# Patient Record
Sex: Male | Born: 1965 | Race: White | Hispanic: No | Smoking: Never smoker
Health system: Southern US, Community
[De-identification: ages and names within clinical notes are randomized; demographics above are authoritative.]

## PROBLEM LIST (undated history)

## (undated) DIAGNOSIS — C61 Malignant neoplasm of prostate: Secondary | ICD-10-CM

## (undated) DIAGNOSIS — I10 Essential (primary) hypertension: Secondary | ICD-10-CM

## (undated) DIAGNOSIS — K219 Gastro-esophageal reflux disease without esophagitis: Secondary | ICD-10-CM

## (undated) HISTORY — DX: Essential (primary) hypertension: I10

## (undated) HISTORY — PX: PROSTATE BIOPSY: SHX241

## (undated) HISTORY — DX: Gastro-esophageal reflux disease without esophagitis: K21.9

---

## 2009-05-04 ENCOUNTER — Ambulatory Visit: Payer: Self-pay | Admitting: Diagnostic Radiology

## 2009-05-04 ENCOUNTER — Emergency Department (HOSPITAL_BASED_OUTPATIENT_CLINIC_OR_DEPARTMENT_OTHER): Admission: EM | Admit: 2009-05-04 | Discharge: 2009-05-04 | Payer: Self-pay | Admitting: Emergency Medicine

## 2010-08-07 IMAGING — CT CT PELVIS W/ CM
2 of 6 series · 15 of 46 positions shown, 17 images · IV contrast (APPLIED)
Comparison: None available.

CT ABDOMEN

CLINICAL DATA: Abdominal pain.  Groin pain after injury.

CT ABDOMEN AND PELVIS WITH CONTRAST
TECHNIQUE: Multidetector CT imaging of the abdomen and pelvis was
performed using the standard protocol following bolus
administration of intravenous contrast.
Contrast: 100 ml [id number].

[Series 2: abd/pelvis 5.0 b31f · axial · 0.74mm/px · z∈[-587,-77]mm · 12 of 112 slices shown, 14 images]
[im 5/112  soft-tissue]
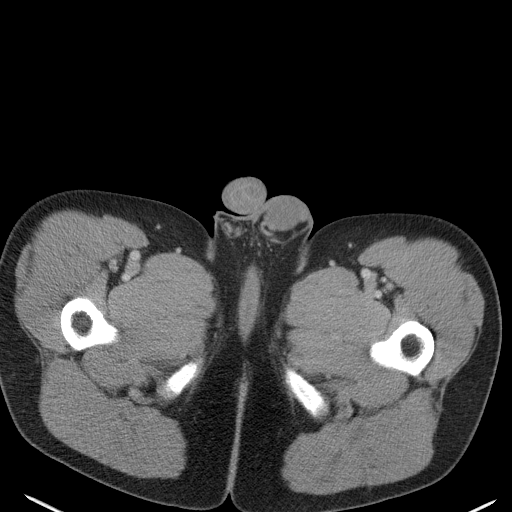
[im 5/112  bone]
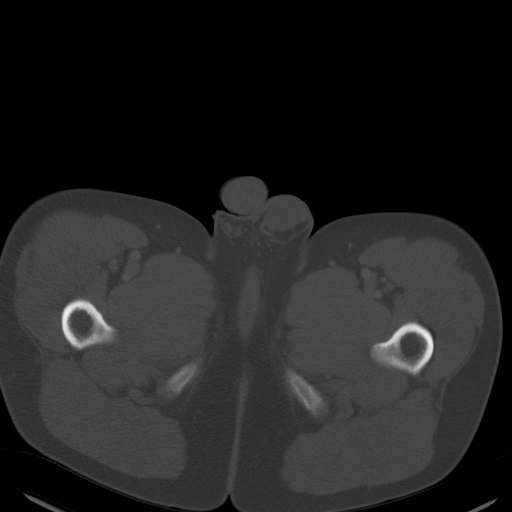
[im 15/112  soft-tissue]
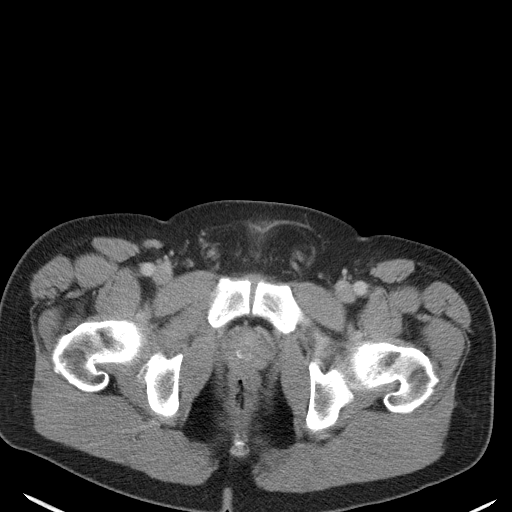
[im 25/112  soft-tissue]
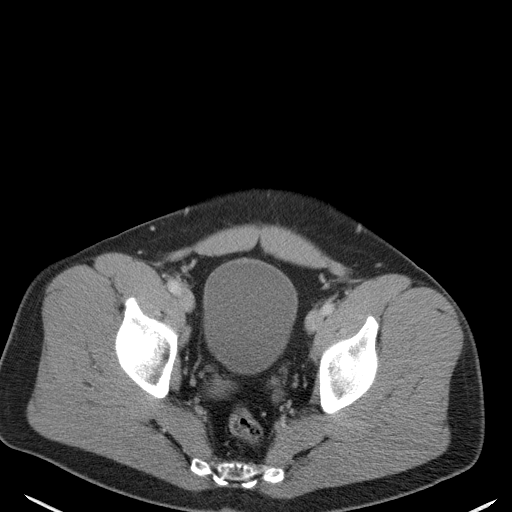
[im 34/112  soft-tissue]
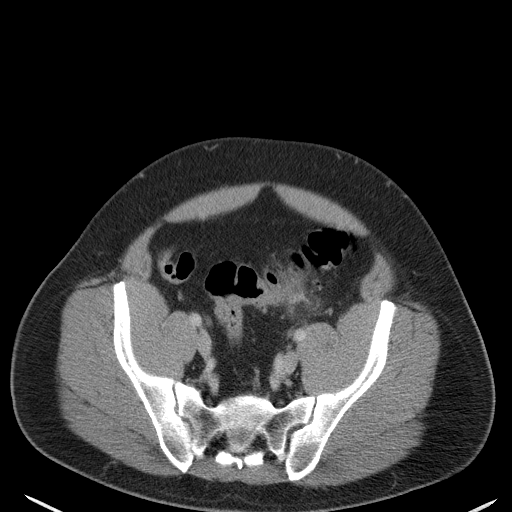
[im 44/112  soft-tissue]
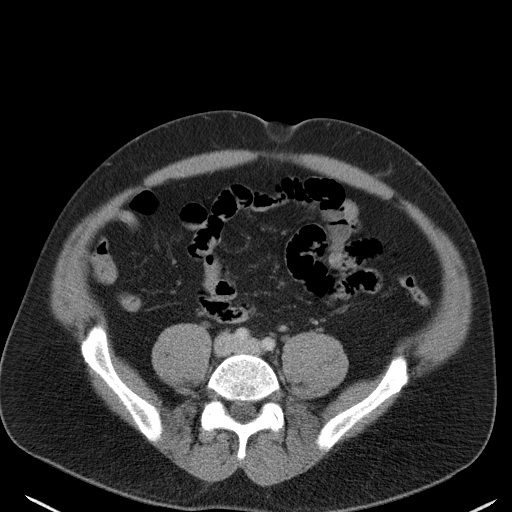
[im 54/112  soft-tissue]
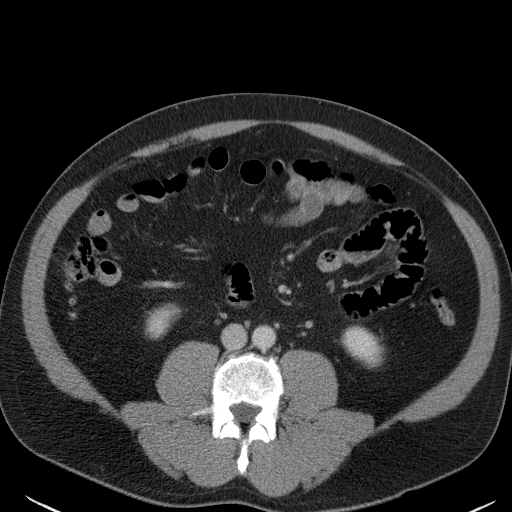
[im 58/112  soft-tissue]
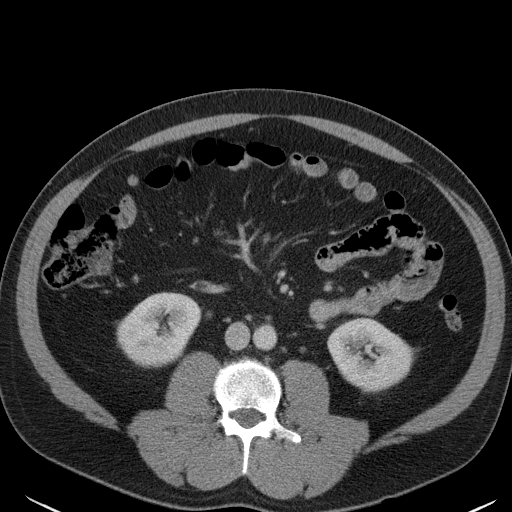
[im 68/112  soft-tissue]
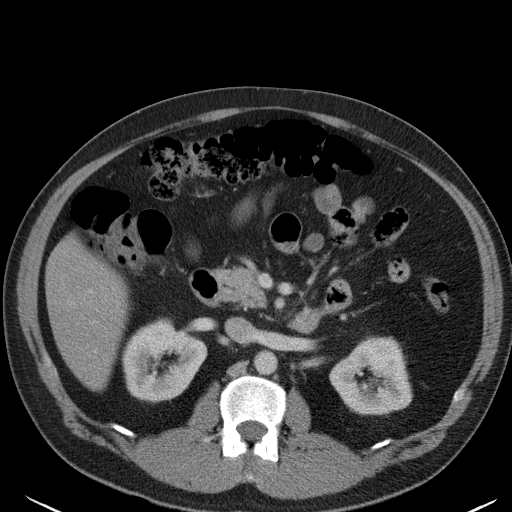
[im 78/112  soft-tissue]
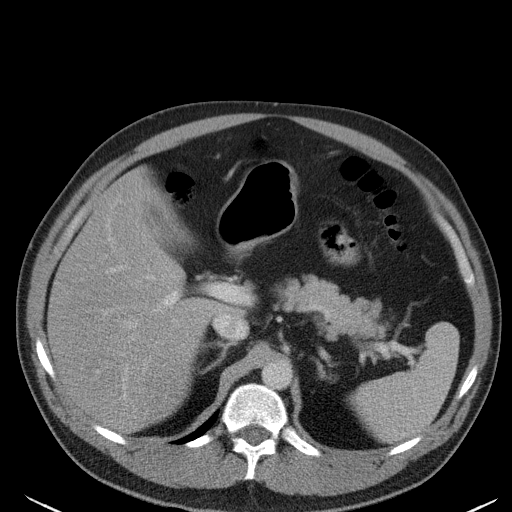
[im 78/112  bone]
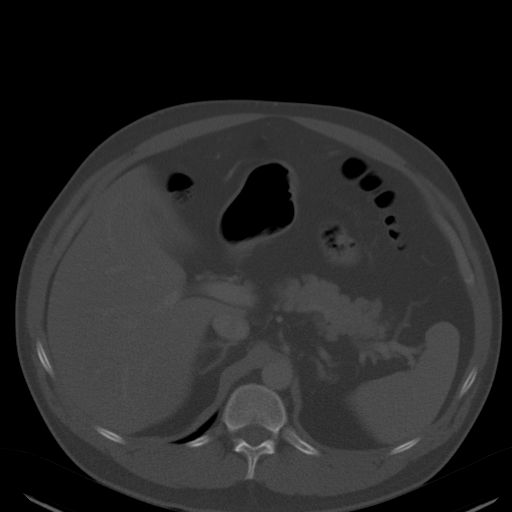
[im 87/112  soft-tissue]
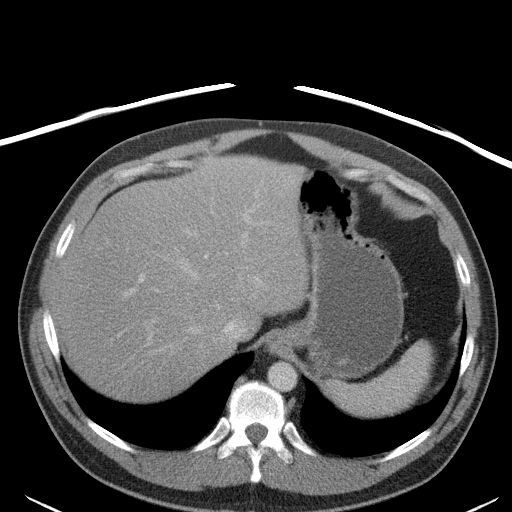
[im 97/112  soft-tissue]
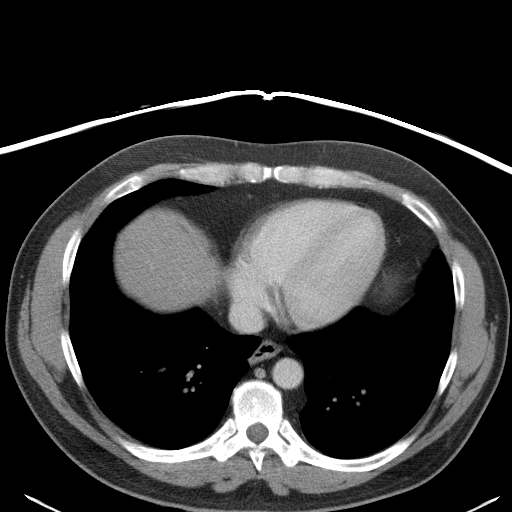
[im 107/112  soft-tissue]
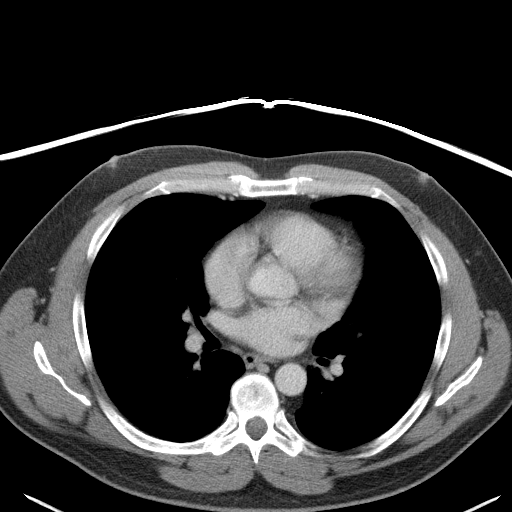

[Series 5: abd/pelvis 3.0 coronal · coronal · 0.68mm/px · 3 of 104 slices shown]
[im 35/104  soft-tissue]
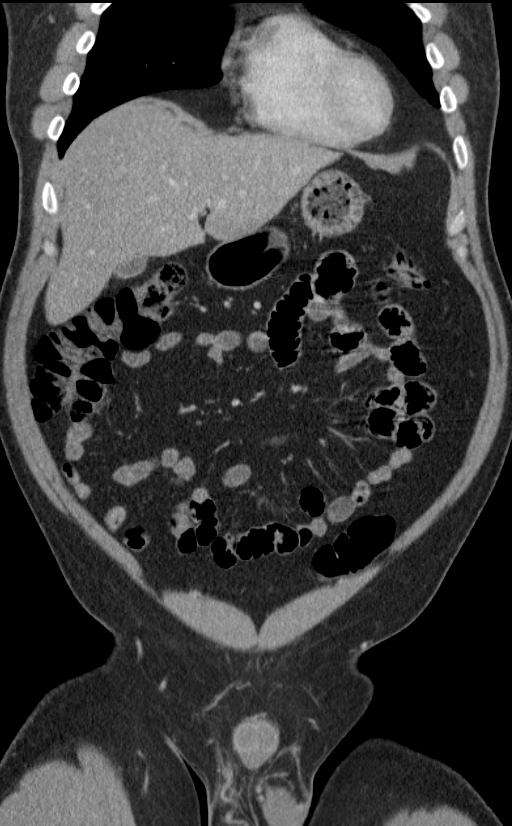
[im 46/104  soft-tissue]
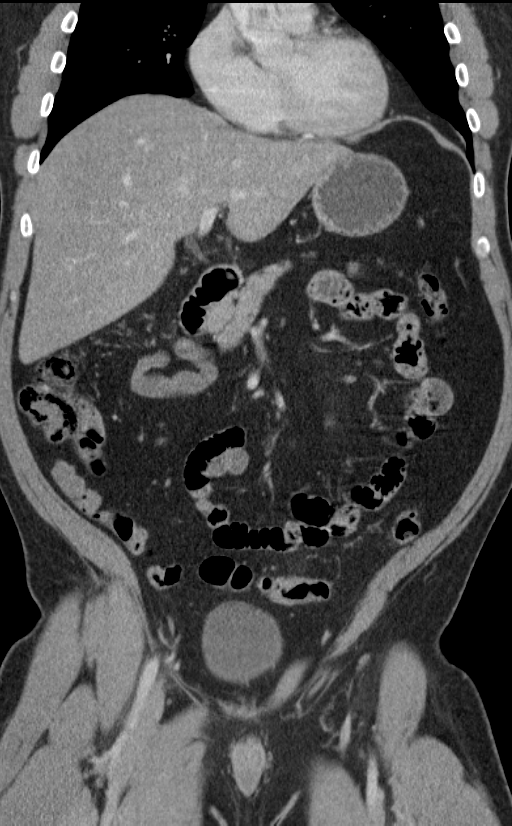
[im 58/104  soft-tissue]
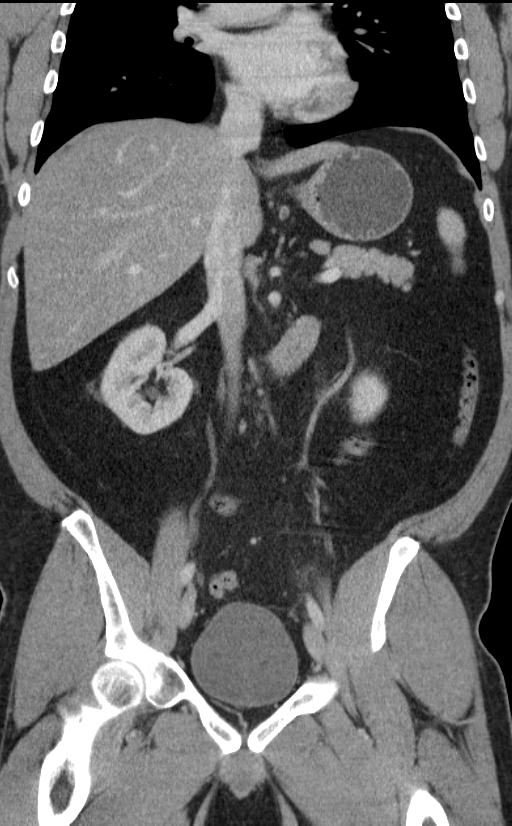

[15 of 46 positions shown; findings below may reference images not displayed]

FINDINGS: The lung bases are clear.  There is no pleural or
pericardial effusion.

The gallbladder is decompressed but otherwise unremarkable.  The
liver, spleen, adrenal glands, pancreas and kidneys all appear
normal.  Stomach and small bowel are normal in appearance.  There
is no abdominal lymphadenopathy or fluid.  No focal bony
abnormality. There is some degenerative disc disease of the lumbar
spine most notable at L2-3.
IMPRESSION: 1.  No acute finding.
2.  Degenerative disc disease lumbar spine.

CT PELVIS
FINDINGS: The patient has diverticular disease of the sigmoid
colon with inflammatory change identified in the left lower
quadrant consistent with diverticulitis.  No abscess or
perforation.  The colon is otherwise unremarkable.  The appendix
appears normal.  Prostate calcification noted.  Seminal vesicles
and urinary bladder are unremarkable.  No lymphadenopathy.  No
evidence of muscle strain.
IMPRESSION: 1.  Study is positive for sigmoid diverticulitis without abscess or
perforation.
2.  Negative for evidence of muscle strain.

## 2011-01-06 LAB — CBC
MCHC: 34 g/dL (ref 30.0–36.0)
MCV: 86.5 fL (ref 78.0–100.0)
Platelets: 350 10*3/uL (ref 150–400)
RBC: 5.08 MIL/uL (ref 4.22–5.81)

## 2011-01-06 LAB — URINALYSIS, ROUTINE W REFLEX MICROSCOPIC
Bilirubin Urine: NEGATIVE
Glucose, UA: NEGATIVE mg/dL
Hgb urine dipstick: NEGATIVE
Ketones, ur: NEGATIVE mg/dL
Protein, ur: NEGATIVE mg/dL

## 2011-01-06 LAB — BASIC METABOLIC PANEL
BUN: 16 mg/dL (ref 6–23)
CO2: 26 mEq/L (ref 19–32)
Chloride: 99 mEq/L (ref 96–112)
Creatinine, Ser: 0.9 mg/dL (ref 0.4–1.5)
Glucose, Bld: 91 mg/dL (ref 70–99)

## 2011-01-06 LAB — DIFFERENTIAL
Basophils Relative: 1 % (ref 0–1)
Eosinophils Absolute: 0.5 10*3/uL (ref 0.0–0.7)
Monocytes Relative: 9 % (ref 3–12)
Neutrophils Relative %: 64 % (ref 43–77)

## 2012-12-21 ENCOUNTER — Emergency Department (HOSPITAL_BASED_OUTPATIENT_CLINIC_OR_DEPARTMENT_OTHER)
Admission: EM | Admit: 2012-12-21 | Discharge: 2012-12-21 | Disposition: A | Discharge status: Home / Self Care | Payer: Self-pay | Attending: Emergency Medicine | Admitting: Emergency Medicine

## 2012-12-21 ENCOUNTER — Encounter (HOSPITAL_BASED_OUTPATIENT_CLINIC_OR_DEPARTMENT_OTHER): Payer: Self-pay | Admitting: Emergency Medicine

## 2012-12-21 ENCOUNTER — Emergency Department (HOSPITAL_BASED_OUTPATIENT_CLINIC_OR_DEPARTMENT_OTHER): Payer: Self-pay

## 2012-12-21 DIAGNOSIS — Y92009 Unspecified place in unspecified non-institutional (private) residence as the place of occurrence of the external cause: Secondary | ICD-10-CM | POA: Insufficient documentation

## 2012-12-21 DIAGNOSIS — W1809XA Striking against other object with subsequent fall, initial encounter: Secondary | ICD-10-CM | POA: Insufficient documentation

## 2012-12-21 DIAGNOSIS — S2249XA Multiple fractures of ribs, unspecified side, initial encounter for closed fracture: Secondary | ICD-10-CM | POA: Insufficient documentation

## 2012-12-21 DIAGNOSIS — S2232XA Fracture of one rib, left side, initial encounter for closed fracture: Secondary | ICD-10-CM

## 2012-12-21 DIAGNOSIS — Y9389 Activity, other specified: Secondary | ICD-10-CM | POA: Insufficient documentation

## 2012-12-21 MED ORDER — HYDROCODONE-ACETAMINOPHEN 5-325 MG PO TABS: 1.0000 | ORAL_TABLET | Freq: Four times a day (QID) | ORAL | Status: DC | PRN

## 2012-12-21 NOTE — ED Notes (Signed)
Pt states he fell onto dining room table onto left ribs about one week ago.  Pain worsens with deep breath, movement, or cough/sneeze.  No sob.

## 2012-12-21 NOTE — ED Provider Notes (Addendum)
History     CSN: [id number]  Arrival date & time 12/21/12  4098   First MD Initiated Contact with Patient 12/21/12 [phone]      Chief Complaint  Patient presents with  . Fall  . Rib Injury    (Consider location/radiation/quality/duration/timing/severity/associated sxs/prior treatment) HPI Comments: Patient states the first 2 days of mild pain on his left side but on Wednesday morning he stretched his bed and felt a crack on the left side with severe pain in his left ribs since  Patient is a 47 y.o. male presenting with fall. The history is provided by the patient.  Fall Incident onset: About 6 days ago. Fall occurred: Larey Seat into his dining room table on his left side. Impact surface: The corner of the dining room table. There was no blood loss. Point of impact: Left chest. Pain location: Left chest. The pain is at a severity of 10/10. The pain is severe. He was ambulatory at the scene. Pertinent negatives include no fever, no abdominal pain, no nausea and no loss of consciousness. Exacerbated by: Deep breathing, coughing and sneezing. He has tried NSAIDs for the symptoms. The treatment provided mild relief.    No past medical history on file.  No past surgical history on file.  No family history on file.  History  Substance Use Topics  . Smoking status: Not on file  . Smokeless tobacco: Not on file  . Alcohol Use: Not on file      Review of Systems  Constitutional: Negative for fever.  Respiratory: Negative for cough and shortness of breath.   Cardiovascular: Positive for chest pain.  Gastrointestinal: Negative for nausea and abdominal pain.  Neurological: Negative for loss of consciousness.    Allergies  Review of patient's allergies indicates not on file.  Home Medications  No current outpatient prescriptions on file.  BP 162/110  Pulse 94  Temp(Src) 97.9 F (36.6 C) (Oral)  Resp 16  Ht 5' 9.5" (1.765 m)  Wt 190 lb (86.183 kg)  BMI 27.67 kg/m2  SpO2  100%  Physical Exam  Nursing note and vitals reviewed. Constitutional: He is oriented to person, place, and time. He appears well-developed and well-nourished. No distress.  HENT:  Head: Normocephalic and atraumatic.  Mouth/Throat: Oropharynx is clear and moist.  Eyes: Conjunctivae and EOM are normal. Pupils are equal, round, and reactive to light.  Neck: Normal range of motion. Neck supple.  Cardiovascular: Normal rate, regular rhythm and intact distal pulses.   No murmur heard. Pulmonary/Chest: Effort normal and breath sounds normal. No respiratory distress. He has no wheezes. He has no rales. He exhibits tenderness. He exhibits no crepitus.    Abdominal: Soft. He exhibits no distension. There is no tenderness. There is no rebound and no guarding.  Musculoskeletal: Normal range of motion. He exhibits no edema and no tenderness.  Neurological: He is alert and oriented to person, place, and time.  Skin: Skin is warm and dry. No rash noted. No erythema.  Psychiatric: He has a normal mood and affect. His behavior is normal.    ED Course  Procedures (including critical care time)  Labs Reviewed - No data to display Dg Ribs Unilateral W/chest Left  12/21/2012  *RADIOLOGY REPORT*  Clinical Data: Pain post fall.  LEFT RIBS AND CHEST - 3+ VIEW  Comparison: None.  Findings: No pneumothorax or effusion.  Lungs clear.  Heart size normal.  Nondisplaced fractures through the posterolateral aspect left tenth and eleventh ribs.  IMPRESSION:  1.  Left tenth and eleventh rib fractures, nondisplaced, with no pneumothorax or other complicating features.   Original Report Authenticated By: D. Andria Rhein, MD      1. Rib fracture, left, closed, initial encounter       MDM   Patient with a mechanical fall approximately one week ago into the dining room table to his left ribs. Initially mild pain but then 2 days later he stretched in bed and felt a severe pain in his left side and the pain has  continued. He denies any shortness of breath or other injury. He has significant point tenderness in the lower left ribs laterally. He satting normally and has oxygen saturation of 100% with good breath sounds bilaterally. X-rays to evaluate for rib fracture pending.    10:05 AM Chest x-ray consistent with a left 10th and 11th rib fracture there are nondisplaced with no pneumothoraces. Findings were discussed with the patient and he was given pain control    Gwyneth Sprout, MD 12/21/12 1006  Gwyneth Sprout, MD 12/21/12 1006  Gwyneth Sprout, MD 12/21/12 1007

## 2018-02-26 ENCOUNTER — Encounter: Payer: Self-pay | Admitting: Internal Medicine

## 2018-03-12 ENCOUNTER — Other Ambulatory Visit: Payer: Self-pay

## 2018-03-12 ENCOUNTER — Ambulatory Visit (AMBULATORY_SURGERY_CENTER): Payer: Self-pay

## 2018-03-12 VITALS — Ht 69.0 in | Wt 174.6 lb

## 2018-03-12 DIAGNOSIS — Z1211 Encounter for screening for malignant neoplasm of colon: Secondary | ICD-10-CM

## 2018-03-12 MED ORDER — NA SULFATE-K SULFATE-MG SULF 17.5-3.13-1.6 GM/177ML PO SOLN: 1.0000 | Freq: Once | ORAL | 0 refills | Status: AC

## 2018-03-12 NOTE — Progress Notes (Signed)
No egg or soy allergy known to patient  Patient verbalize no having had any prior procedures or surgeries.  No diet pills per patient No home 02 use per patient  No blood thinners per patient  Pt denies issues with constipation  No A fib or A flutter  EMMI video sent to pt's e mail , sent video

## 2018-03-13 ENCOUNTER — Encounter: Payer: Self-pay | Admitting: Internal Medicine

## 2018-03-26 ENCOUNTER — Encounter: Payer: Self-pay | Admitting: Internal Medicine

## 2020-04-22 ENCOUNTER — Other Ambulatory Visit: Payer: Self-pay | Admitting: Urology

## 2020-04-22 ENCOUNTER — Other Ambulatory Visit (HOSPITAL_COMMUNITY): Payer: Self-pay | Admitting: Urology

## 2020-04-22 DIAGNOSIS — C61 Malignant neoplasm of prostate: Secondary | ICD-10-CM

## 2020-04-22 DIAGNOSIS — R972 Elevated prostate specific antigen [PSA]: Secondary | ICD-10-CM

## 2020-05-02 ENCOUNTER — Ambulatory Visit (HOSPITAL_COMMUNITY): Payer: 59

## 2020-05-02 ENCOUNTER — Other Ambulatory Visit (HOSPITAL_COMMUNITY): Payer: 59

## 2020-05-06 ENCOUNTER — Encounter (HOSPITAL_COMMUNITY)
Admission: RE | Admit: 2020-05-06 | Discharge: 2020-05-06 | Disposition: A | Discharge status: Home / Self Care | Payer: 59 | Source: Ambulatory Visit | Attending: Urology | Admitting: Urology

## 2020-05-06 ENCOUNTER — Other Ambulatory Visit: Payer: Self-pay

## 2020-05-06 DIAGNOSIS — C61 Malignant neoplasm of prostate: Secondary | ICD-10-CM | POA: Insufficient documentation

## 2020-05-06 MED ORDER — TECHNETIUM TC 99M MEDRONATE IV KIT
19.6000 | PACK | Freq: Once | INTRAVENOUS | Status: AC
  Administered 2020-05-06: 19.6 via INTRAVENOUS

## 2020-05-20 ENCOUNTER — Other Ambulatory Visit: Payer: Self-pay

## 2020-05-20 ENCOUNTER — Ambulatory Visit
Admission: RE | Admit: 2020-05-20 | Discharge: 2020-05-20 | Disposition: A | Discharge status: Home / Self Care | Payer: 59 | Source: Ambulatory Visit | Attending: Urology | Admitting: Urology

## 2020-05-20 DIAGNOSIS — R972 Elevated prostate specific antigen [PSA]: Secondary | ICD-10-CM

## 2020-05-20 DIAGNOSIS — C61 Malignant neoplasm of prostate: Secondary | ICD-10-CM

## 2020-05-20 MED ORDER — GADOBENATE DIMEGLUMINE 529 MG/ML IV SOLN
15.0000 mL | Freq: Once | INTRAVENOUS | Status: AC | PRN
  Administered 2020-05-20: 15 mL via INTRAVENOUS

## 2020-06-27 ENCOUNTER — Other Ambulatory Visit: Payer: Self-pay | Admitting: Urology

## 2020-08-09 NOTE — Patient Instructions (Addendum)
DUE TO COVID-19 ONLY ONE VISITOR IS ALLOWED TO COME WITH YOU AND STAY IN THE WAITING ROOM ONLY DURING PRE OP AND PROCEDURE.   IF YOU WILL BE ADMITTED INTO THE HOSPITAL YOU ARE ALLOWED ONE SUPPORT PERSON DURING VISITATION HOURS ONLY (10AM -8PM)   . The support person may change daily. . The support person must pass our screening, gel in and out, and wear a mask at all times, including in the patient's room. . Patients must also wear a mask when staff or their support person are in the room.   COVID SWAB TESTING MUST BE COMPLETED ON:   Monday, 08-15-20 @ 3:00 PM   [address] [place],  [address]  (Must self quarantine after testing. Follow instructions on handout.)        Your procedure is scheduled on:  Thursday, 08-18-20   Report to [facility] Main  Entrance   Report to Short Stay at 5:30 AM   [facility])    Call this number if you have problems the morning of surgery [phone]    Follow Prep per surgeon's office  8 ox of Magnesium citrate at noon the day before surgery  One Fleet Enema the night before surgery   Do not eat food :After Midnight.   May have liquids until 4:15 AM day of surgery  CLEAR LIQUID DIET  Foods Allowed                                                                     Foods Excluded  Water, Black Coffee and tea, regular and decaf               liquids that you cannot  Plain Jell-O in any flavor  (No red)                                      see through such as: Fruit ices (not with fruit pulp)                                      milk, soups, orange juice              Iced Popsicles (No red)                                      All solid food                                   Apple juices Sports drinks like Gatorade (No red) Lightly seasoned clear broth or consume(fat free) Sugar, honey syrup     Oral Hygiene is also important to reduce your risk of infection.                                    Remember - BRUSH YOUR TEETH  THE MORNING OF  SURGERY WITH YOUR REGULAR TOOTHPASTE   Do NOT smoke after Midnight   Take these medicines the morning of surgery with A SIP OF WATER:   None.                           You may not have any metal on your body including jewelry, and body piercings             Do not wear lotions, powders, perfumes/cologne, or deodorant             Men may shave face and neck.   Do not bring valuables to the hospital. [place].   Contacts, dentures or bridgework may not be worn into surgery.   Bring small overnight bag day of surgery.               Please read over the following fact sheets you were given: IF YOU HAVE QUESTIONS ABOUT YOUR PRE OP INSTRUCTIONS PLEASE CALL [phone]   [facility] - Preparing for Surgery Before surgery, you can play an important role.  Because skin is not sterile, your skin needs to be as free of germs as possible.  You can reduce the number of germs on your skin by washing with CHG (chlorahexidine gluconate) soap before surgery.  CHG is an antiseptic cleaner which kills germs and bonds with the skin to continue killing germs even after washing. Please DO NOT use if you have an allergy to CHG or antibacterial soaps.  If your skin becomes reddened/irritated stop using the CHG and inform your nurse when you arrive at Short Stay. Do not shave (including legs and underarms) for at least 48 hours prior to the first CHG shower.  You may shave your face/neck.  Please follow these instructions carefully:  1.  Shower with CHG Soap the night before surgery and the  morning of surgery.  2.  If you choose to wash your hair, wash your hair first as usual with your normal  shampoo.  3.  After you shampoo, rinse your hair and body thoroughly to remove the shampoo.                             4.  Use CHG as you would any other liquid soap.  You can apply chg directly to the skin and wash.  Gently with a scrungie or clean washcloth.  5.   Apply the CHG Soap to your body ONLY FROM THE NECK DOWN.   Do   not use on face/ open                           Wound or open sores. Avoid contact with eyes, ears mouth and   genitals (private parts).                       Wash face,  Genitals (private parts) with your normal soap.             6.  Wash thoroughly, paying special attention to the area where your    surgery  will be performed.  7.  Thoroughly rinse your body with warm water from the neck down.  8.  DO NOT shower/wash with your normal soap after using and rinsing off the CHG Soap.  9.  Pat yourself dry with a clean towel.            10.  Wear clean pajamas.            11.  Place clean sheets on your bed the night of your first shower and do not  sleep with pets. Day of Surgery : Do not apply any lotions/deodorants the morning of surgery.  Please wear clean clothes to the hospital/surgery center.  FAILURE TO FOLLOW THESE INSTRUCTIONS MAY RESULT IN THE CANCELLATION OF YOUR SURGERY  PATIENT SIGNATURE_________________________________  NURSE SIGNATURE__________________________________  ________________________________________________________________________   Hayden Cooper  An incentive spirometer is a tool that can help keep your lungs clear and active. This tool measures how well you are filling your lungs with each breath. Taking long deep breaths may help reverse or decrease the chance of developing breathing (pulmonary) problems (especially infection) following:  A long period of time when you are unable to move or be active. BEFORE THE PROCEDURE   If the spirometer includes an indicator to show your best effort, your nurse or respiratory therapist will set it to a desired goal.  If possible, sit up straight or lean slightly forward. Try not to slouch.  Hold the incentive spirometer in an upright position. INSTRUCTIONS FOR USE  1. Sit on the edge of your bed if possible, or sit up as far as you can  in bed or on a chair. 2. Hold the incentive spirometer in an upright position. 3. Breathe out normally. 4. Place the mouthpiece in your mouth and seal your lips tightly around it. 5. Breathe in slowly and as deeply as possible, raising the piston or the ball toward the top of the column. 6. Hold your breath for 3-5 seconds or for as long as possible. Allow the piston or ball to fall to the bottom of the column. 7. Remove the mouthpiece from your mouth and breathe out normally. 8. Rest for a few seconds and repeat Steps 1 through 7 at least 10 times every 1-2 hours when you are awake. Take your time and take a few normal breaths between deep breaths. 9. The spirometer may include an indicator to show your best effort. Use the indicator as a goal to work toward during each repetition. 10. After each set of 10 deep breaths, practice coughing to be sure your lungs are clear. If you have an incision (the cut made at the time of surgery), support your incision when coughing by placing a pillow or rolled up towels firmly against it. Once you are able to get out of bed, walk around indoors and cough well. You may stop using the incentive spirometer when instructed by your caregiver.  RISKS AND COMPLICATIONS  Take your time so you do not get dizzy or light-headed.  If you are in pain, you may need to take or ask for pain medication before doing incentive spirometry. It is harder to take a deep breath if you are having pain. AFTER USE  Rest and breathe slowly and easily.  It can be helpful to keep track of a log of your progress. Your caregiver can provide you with a simple table to help with this. If you are using the spirometer at home, follow these instructions: [place] IF:   You are having difficultly using the spirometer.  You have trouble using the spirometer as often as instructed.  Your pain medication is not giving enough relief while using the spirometer.  You  develop fever of  100.5 F (38.1 C) or higher. SEEK IMMEDIATE MEDICAL CARE IF:   You cough up bloody sputum that had not been present before.  You develop fever of 102 F (38.9 C) or greater.  You develop worsening pain at or near the incision site. MAKE SURE YOU:   Understand these instructions.  Will watch your condition.  Will get help right away if you are not doing well or get worse. Document Released: 01/28/2007 Document Revised: 12/10/2011 Document Reviewed: 03/31/2007 [employer] Patient Information 2014 [employer], [place].   ________________________________________________________________________  WHAT IS A BLOOD TRANSFUSION? Blood Transfusion Information  A transfusion is the replacement of blood or some of its parts. Blood is made up of multiple cells which provide different functions.  Red blood cells carry oxygen and are used for blood loss replacement.  White blood cells fight against infection.  Platelets control bleeding.  Plasma helps clot blood.  Other blood products are available for specialized needs, such as hemophilia or other clotting disorders. BEFORE THE TRANSFUSION  Who gives blood for transfusions?   Healthy volunteers who are fully evaluated to make sure their blood is safe. This is blood bank blood. Transfusion therapy is the safest it has ever been in the practice of medicine. Before blood is taken from a donor, a complete history is taken to make sure that person has no history of diseases nor engages in risky social behavior (examples are intravenous drug use or sexual activity with multiple partners). The donor's travel history is screened to minimize risk of transmitting infections, such as malaria. The donated blood is tested for signs of infectious diseases, such as HIV and hepatitis. The blood is then tested to be sure it is compatible with you in order to minimize the chance of a transfusion reaction. If you or a relative donates blood, this is often done in  anticipation of surgery and is not appropriate for emergency situations. It takes many days to process the donated blood. RISKS AND COMPLICATIONS Although transfusion therapy is very safe and saves many lives, the main dangers of transfusion include:   Getting an infectious disease.  Developing a transfusion reaction. This is an allergic reaction to something in the blood you were given. Every precaution is taken to prevent this. The decision to have a blood transfusion has been considered carefully by your caregiver before blood is given. Blood is not given unless the benefits outweigh the risks. AFTER THE TRANSFUSION  Right after receiving a blood transfusion, you will usually feel much better and more energetic. This is especially true if your red blood cells have gotten low (anemic). The transfusion raises the level of the red blood cells which carry oxygen, and this usually causes an energy increase.  The nurse administering the transfusion will monitor you carefully for complications. HOME CARE INSTRUCTIONS  No special instructions are needed after a transfusion. You may find your energy is better. Speak with your caregiver about any limitations on activity for underlying diseases you may have. SEEK MEDICAL CARE IF:   Your condition is not improving after your transfusion.  You develop redness or irritation at the intravenous (IV) site. SEEK IMMEDIATE MEDICAL CARE IF:  Any of the following symptoms occur over the next 12 hours:  Shaking chills.  You have a temperature by mouth above 102 F (38.9 C), not controlled by medicine.  Chest, back, or muscle pain.  People around you feel you are not acting correctly or are confused.  Shortness of  breath or difficulty breathing.  Dizziness and fainting.  You get a rash or develop hives.  You have a decrease in urine output.  Your urine turns a dark color or changes to pink, red, or brown. Any of the following symptoms occur over  the next 10 days:  You have a temperature by mouth above 102 F (38.9 C), not controlled by medicine.  Shortness of breath.  Weakness after normal activity.  The white part of the eye turns yellow (jaundice).  You have a decrease in the amount of urine or are urinating less often.  Your urine turns a dark color or changes to pink, red, or brown. Document Released: 09/14/2000 Document Revised: 12/10/2011 Document Reviewed: 05/03/2008 [facility] Patient Information 2014 [place], [place].  _______________________________________________________________________

## 2020-08-09 NOTE — Progress Notes (Addendum)
COVID Vaccine Completed:  x2 Date COVID Vaccine completed:  02-25-20 & 03-21-20 COVID vaccine manufacturer: [place]   PCP - Eldridge Abrahams, NP Cardiologist -   Chest x-ray -  EKG - 08-10-20 in Epic Stress Test -  ECHO -  Cardiac Cath -  Pacemaker/ICD device last checked:  Sleep Study -  CPAP -   Fasting Blood Sugar -  Checks Blood Sugar _____ times a day  Blood Thinner Instructions: Aspirin Instructions: Last Dose:  Anesthesia review:   Patient denies shortness of breath, fever, cough and chest pain at PAT appointment   Patient verbalized understanding of instructions that were given to them at the PAT appointment. Patient was also instructed that they will need to review over the PAT instructions again at home before surgery.

## 2020-08-10 ENCOUNTER — Encounter (HOSPITAL_COMMUNITY): Payer: Self-pay

## 2020-08-10 ENCOUNTER — Encounter (HOSPITAL_COMMUNITY)
Admission: RE | Admit: 2020-08-10 | Discharge: 2020-08-10 | Disposition: A | Discharge status: Home / Self Care | Payer: 59 | Source: Ambulatory Visit | Attending: Urology | Admitting: Urology

## 2020-08-10 ENCOUNTER — Other Ambulatory Visit: Payer: Self-pay

## 2020-08-10 DIAGNOSIS — I10 Essential (primary) hypertension: Secondary | ICD-10-CM | POA: Insufficient documentation

## 2020-08-10 DIAGNOSIS — Z01818 Encounter for other preprocedural examination: Secondary | ICD-10-CM | POA: Insufficient documentation

## 2020-08-10 HISTORY — DX: Malignant neoplasm of prostate: C61

## 2020-08-10 LAB — CBC
HCT: 44.3 % (ref 39.0–52.0)
Hemoglobin: 14.5 g/dL (ref 13.0–17.0)
MCH: 28.8 pg (ref 26.0–34.0)
MCHC: 32.7 g/dL (ref 30.0–36.0)
MCV: 87.9 fL (ref 80.0–100.0)
Platelets: 395 10*3/uL (ref 150–400)
RBC: 5.04 MIL/uL (ref 4.22–5.81)
RDW: 13.4 % (ref 11.5–15.5)
WBC: 13.8 10*3/uL — ABNORMAL HIGH (ref 4.0–10.5)
nRBC: 0 % (ref 0.0–0.2)

## 2020-08-10 LAB — BASIC METABOLIC PANEL
Anion gap: 9 (ref 5–15)
BUN: 13 mg/dL (ref 6–20)
CO2: 27 mmol/L (ref 22–32)
Calcium: 9.2 mg/dL (ref 8.9–10.3)
Chloride: 104 mmol/L (ref 98–111)
Creatinine, Ser: 0.92 mg/dL (ref 0.61–1.24)
GFR, Estimated: >60 mL/min (ref >60)
Glucose, Bld: 100 mg/dL — ABNORMAL HIGH (ref 70–99)
Potassium: 4 mmol/L (ref 3.5–5.1)
Sodium: 140 mmol/L (ref 135–145)

## 2020-08-16 ENCOUNTER — Other Ambulatory Visit (HOSPITAL_COMMUNITY)
Admission: RE | Admit: 2020-08-16 | Discharge: 2020-08-16 | Disposition: A | Discharge status: Home / Self Care | Payer: 59 | Source: Ambulatory Visit | Attending: Urology | Admitting: Urology

## 2020-08-16 DIAGNOSIS — Z20822 Contact with and (suspected) exposure to covid-19: Secondary | ICD-10-CM | POA: Insufficient documentation

## 2020-08-16 DIAGNOSIS — Z01812 Encounter for preprocedural laboratory examination: Secondary | ICD-10-CM | POA: Insufficient documentation

## 2020-08-16 LAB — SARS CORONAVIRUS 2 (TAT 6-24 HRS): SARS Coronavirus 2: NEGATIVE

## 2020-08-17 NOTE — H&P (Signed)
Office Visit Report     08/09/2020   --------------------------------------------------------------------------------   Hayden Cooper  MRN: [id number]  DOB: 1966/05/03, 54 year old Male  SSN:    PRIMARY CARE:  Berkley Harvey, NP  REFERRING:  Berkley Harvey, NP  PROVIDER:  Festus Aloe, M.D.  TREATING:  [address] [place], [place]  LOCATION:  [facility] [phone]     --------------------------------------------------------------------------------   CC/HPI: Pt presents today for pre-operative history and physical exam in anticipation of robotic assisted lap radical prostatectomy with bilateral pelvic lymph node dissection and umbilical hernia repair on 08/18/20 by Dr. Alinda Money. He is doing well and is without complaint. Pt denies F/C, HA, CP, SOB, N/V, diarrhea/constipation, back pain, flank pain, hematuria, and dysuria.   HX:   CC: Prostate Cancer   Physician requesting consult: Dr. Eda Keys  PCP: Eldridge Abrahams, NP   Mr. Hayden Cooper is a 54 year old gentleman who was diagnosed with intermediate risk prostate cancer in December 2019. His PSA was 10.8 at the time and he underwent a TRUS biopsy of the prostate by Dr. Junious Silk on 09/12/18 that confirmed Gleason 3+4=7 adenocarcinoma with 2 out of 12 biopsy cores positive for malignancy. He had discussed proceeding with surgical treatment but ultimately did not proceed and did not follow up. This was partly due to the beginning of the pandemic and the fact that he was going through a divorce at the time. He again presented recently to Dr. Junious Silk in July 2021 and his PSA had increased to 20.6. He underwent staging imaging with a bone scan and MRI of the prostate/pelvis that did not demonstrate evidence of metastatic disease but did suggest locally advanced disease. He is referred to discuss definitive surgical therapy at this time after further discussion with Dr. Junious Silk.   Family history: None.   Imaging  studies:  Bone scan (05/06/20): Negative for metastatic disease.  MRI (05/20/20): 2 cm anterior lesion with evidence of EPE anteriorly. No SVI or bone lesions. Non-pathologically enlarged 6 mm left external iliac lymph node.   PMH: He has a history of hypertension and gout.  PSH: No abdominal surgeries.   TNM stage: cT3a N0 M0  PSA: 20.6  Gleason score: 3+4=7  Biopsy (09/12/18): 2/12 cores positive  Left: L apex (30%, 3+3=6)  Right: R apex (40%, 3+4=7)  Prostate volume: 20.6 cc   Nomogram  OC disease: 23%  EPE: 75%  SVI: 7%  LNI: 11%  PFS (5 year, 10 year): 54%, 39%   Urinary function: IPSS is 17.  Erectile function: SHIM score is 21.     ALLERGIES: None    MEDICATIONS: Amlodipine Besylate-Benazepril 5 mg-20 mg capsule     GU PSH: Prostate Needle Biopsy - 09/12/2018     NON-GU PSH: Surgical Pathology, Gross And Microscopic Examination For Prostate Needle - 09/12/2018     GU PMH: Prostate Cancer - 06/14/2020, He watched something about "ingesting something" to treat the prostate cancer. Discussed again surgery vs radiation. If PSA lower/stable we can consider a MRI and he is interested in surgery. If PSA is higher we will proceed with a formal bx and restaging to plan treatment. DRE was nl today. , - 04/12/2020, - 2020, - 09/19/2018 Elevated PSA - 09/12/2018, - 2019 BPH w/LUTS - 2019 ED due to arterial insufficiency - 2019 Urinary Frequency - 2019 Weak Urinary Stream - 2019    NON-GU PMH: Gout Hypertension    FAMILY HISTORY: None    Notes:  2 sons  Maternal uncle with CaP s/p surgical excision   SOCIAL HISTORY: Marital Status: Divorced Preferred Language: English; Ethnicity: Not Hispanic Or Latino; Race: White Current Smoking Status: Patient has never smoked.   Tobacco Use Assessment Completed: Used Tobacco in last 30 days? Drinks 2 drinks per day.  Does not use drugs. Drinks 1 caffeinated drink per day. Has not had a blood transfusion. 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     Notes: ETOH 4 or 5 glasses of mixed vodka drinks 3-4x/week    REVIEW OF SYSTEMS:    GU Review Male:   Patient reports have to strain to urinate . Patient denies frequent urination, hard to postpone urination, burning/ pain with urination, get up at night to urinate, leakage of urine, stream starts and stops, trouble starting your stream, erection problems, and penile pain.  Gastrointestinal (Upper):   Patient denies nausea, vomiting, and indigestion/ heartburn.  Gastrointestinal (Lower):   Patient denies diarrhea and constipation.  Constitutional:   Patient denies weight loss, fatigue, fever, and night sweats.  Skin:   Patient denies skin rash/ lesion and itching.  Eyes:   Patient denies blurred vision and double vision.  Ears/ Nose/ Throat:   Patient denies sore throat and sinus problems.  Hematologic/Lymphatic:   Patient denies swollen glands and easy bruising.  Cardiovascular:   Patient denies leg swelling and chest pains.  Respiratory:   Patient denies cough and shortness of breath.  Endocrine:   Patient denies excessive thirst.  Musculoskeletal:   Patient denies back pain and joint pain.  Neurological:   Patient denies headaches and dizziness.  Psychologic:   Patient denies depression and anxiety.   VITAL SIGNS:      08/09/2020 01:32 PM  Weight 171 lb / 77.56 kg  Height 69 in / 175.26 cm  BP 152/91 mmHg  Pulse 71 /min  Temperature 98.9 F / 37.1 C  BMI 25.2 kg/m   MULTI-SYSTEM PHYSICAL EXAMINATION:    Constitutional: Well-nourished. No physical deformities. Normally developed. Good grooming.  Neck: Neck symmetrical, not swollen. Normal tracheal position.  Respiratory: Normal breath sounds. No labored breathing, no use of accessory muscles.   Cardiovascular: Regular rate and rhythm. No murmur, no gallop.   Lymphatic: No enlargement of neck, axillae, groin.  Skin: No paleness, no jaundice, no cyanosis. No lesion, no ulcer, no rash.  Neurologic /  Psychiatric: Oriented to time, oriented to place, oriented to person. No depression, no anxiety, no agitation.  Gastrointestinal: No mass, no tenderness, no rigidity, non obese abdomen.  Eyes: Normal conjunctivae. Normal eyelids.  Ears, Nose, Mouth, and Throat: Left ear no scars, no lesions, no masses. Right ear no scars, no lesions, no masses. Nose no scars, no lesions, no masses. Normal hearing. Normal lips.  Musculoskeletal: Normal gait and station of head and neck.     Complexity of Data:  Records Review:   Previous Patient Records  Urine Test Review:   Urinalysis   08/09/20  Urinalysis  Urine Appearance Clear   Urine Color Straw   Urine Glucose Neg mg/dL  Urine Bilirubin Neg mg/dL  Urine Ketones Neg mg/dL  Urine Specific Gravity 1.015   Urine Blood Neg ery/uL  Urine pH 7.0   Urine Protein Neg mg/dL  Urine Urobilinogen 0.2 mg/dL  Urine Nitrites Neg   Urine Leukocyte Esterase Neg leu/uL   PROCEDURES:          Urinalysis - 81003 Dipstick Dipstick Cont'd  Specimen: Voided Bilirubin: Neg mg/dL  Color: Straw Ketones:  Neg mg/dL  Appearance: Clear Blood: Neg ery/uL  Specific Gravity: 1.015 Protein: Neg mg/dL  pH: 7.0 Urobilinogen: 0.2 mg/dL  Glucose: Neg mg/dL Nitrites: Neg    Leukocyte Esterase: Neg leu/uL    ASSESSMENT:      ICD-10 Details  1 GU:   Prostate Cancer - C61    PLAN:           Schedule Return Visit/Planned Activity: Keep Scheduled Appointment - Schedule Surgery          Document Letter(s):  Created for Patient: Clinical Summary         Notes:   There are no changes in the patients history or physical exam since last evaluation by Dr. Alinda Money. Pt is scheduled to undergo RALP with BPLND and umbilical hernia repair on 08/18/20.   All pt's questions were answered to the best of my ability.          Next Appointment:      Next Appointment: 08/09/2020 01:30 PM    Appointment Type: Pre OP Appontment    Location: [facility] [phone]     Provider: Mcarthur Rossetti, PA    Reason for Visit: PRE-OP Sunnie Odden ON 08/18/2020      * Signed by Mcarthur Rossetti, PA on 08/09/20 at 3:18 PM (EST*

## 2020-08-17 NOTE — Anesthesia Preprocedure Evaluation (Addendum)
Anesthesia Evaluation  Patient identified by MRN, date of birth, ID band Patient awake    Reviewed: Allergy & Precautions, NPO status , Patient's Chart, lab work & pertinent test results  Airway Mallampati: II  TM Distance: >3 FB     Dental   Pulmonary neg pulmonary ROS,    breath sounds clear to auscultation       Cardiovascular hypertension,  Rhythm:Regular Rate:Normal     Neuro/Psych negative neurological ROS     GI/Hepatic Neg liver ROS, GERD  ,  Endo/Other  negative endocrine ROS  Renal/GU      Musculoskeletal   Abdominal   Peds  Hematology   Anesthesia Other Findings   Reproductive/Obstetrics                            Anesthesia Physical Anesthesia Plan  ASA: II  Anesthesia Plan: General   Post-op Pain Management:    Induction: Intravenous  PONV Risk Score and Plan: 2 and Ondansetron, Dexamethasone and Midazolam  Airway Management Planned: Oral ETT  Additional Equipment:   Intra-op Plan:   Post-operative Plan: Extubation in OR  Informed Consent: I have reviewed the patients History and Physical, chart, labs and discussed the procedure including the risks, benefits and alternatives for the proposed anesthesia with the patient or authorized representative who has indicated his/her understanding and acceptance.     Dental advisory given  Plan Discussed with: CRNA and Anesthesiologist  Anesthesia Plan Comments:       Anesthesia Quick Evaluation

## 2020-08-18 ENCOUNTER — Encounter (HOSPITAL_COMMUNITY)
Admission: RE | Disposition: A | Discharge status: Home / Self Care | Payer: Self-pay | Source: Home / Self Care | Attending: Urology

## 2020-08-18 ENCOUNTER — Observation Stay (HOSPITAL_COMMUNITY)
Admission: RE | Admit: 2020-08-18 | Discharge: 2020-08-20 | Disposition: A | Discharge status: Home / Self Care | Payer: 59 | Attending: Urology | Admitting: Urology

## 2020-08-18 ENCOUNTER — Ambulatory Visit (HOSPITAL_COMMUNITY): Payer: 59 | Admitting: Anesthesiology

## 2020-08-18 ENCOUNTER — Encounter (HOSPITAL_COMMUNITY): Payer: Self-pay | Admitting: Urology

## 2020-08-18 DIAGNOSIS — I10 Essential (primary) hypertension: Secondary | ICD-10-CM | POA: Insufficient documentation

## 2020-08-18 DIAGNOSIS — Z79899 Other long term (current) drug therapy: Secondary | ICD-10-CM | POA: Diagnosis not present

## 2020-08-18 DIAGNOSIS — C61 Malignant neoplasm of prostate: Principal | ICD-10-CM | POA: Insufficient documentation

## 2020-08-18 HISTORY — PX: UMBILICAL HERNIA REPAIR: SHX196

## 2020-08-18 HISTORY — PX: ROBOT ASSISTED LAPAROSCOPIC RADICAL PROSTATECTOMY: SHX5141

## 2020-08-18 HISTORY — PX: LYMPHADENECTOMY: SHX5960

## 2020-08-18 LAB — TYPE AND SCREEN
ABO/RH(D): O POS
Antibody Screen: NEGATIVE

## 2020-08-18 LAB — BASIC METABOLIC PANEL
Anion gap: 10 (ref 5–15)
BUN: 14 mg/dL (ref 6–20)
CO2: 24 mmol/L (ref 22–32)
Calcium: 8.6 mg/dL — ABNORMAL LOW (ref 8.9–10.3)
Chloride: 101 mmol/L (ref 98–111)
Creatinine, Ser: 0.98 mg/dL (ref 0.61–1.24)
GFR, Estimated: >60 mL/min (ref >60)
Glucose, Bld: 155 mg/dL — ABNORMAL HIGH (ref 70–99)
Potassium: 4.6 mmol/L (ref 3.5–5.1)
Sodium: 135 mmol/L (ref 135–145)

## 2020-08-18 LAB — HEMOGLOBIN AND HEMATOCRIT, BLOOD
HCT: 45 % (ref 39.0–52.0)
Hemoglobin: 14.7 g/dL (ref 13.0–17.0)

## 2020-08-18 LAB — ABO/RH: ABO/RH(D): O POS

## 2020-08-18 SURGERY — XI ROBOTIC ASSISTED LAPAROSCOPIC RADICAL PROSTATECTOMY LEVEL 2
Anesthesia: General

## 2020-08-18 MED ORDER — SUCCINYLCHOLINE CHLORIDE 20 MG/ML IJ SOLN
INTRAMUSCULAR | Status: DC | PRN
  Administered 2020-08-18: 150 mg via INTRAVENOUS

## 2020-08-18 MED ORDER — ONDANSETRON HCL 4 MG/2ML IJ SOLN
4.0000 mg | INTRAMUSCULAR | Status: DC | PRN
  Administered 2020-08-19 – 2020-08-20 (×3): 4 mg via INTRAVENOUS
  Filled 2020-08-18 (×3): qty 2

## 2020-08-18 MED ORDER — CEFAZOLIN SODIUM-DEXTROSE 1-4 GM/50ML-% IV SOLN
INTRAVENOUS | Status: AC
  Filled 2020-08-18: qty 50

## 2020-08-18 MED ORDER — HYDROMORPHONE HCL 1 MG/ML IJ SOLN
0.2500 mg | INTRAMUSCULAR | Status: DC | PRN
  Administered 2020-08-18 (×2): 0.5 mg via INTRAVENOUS

## 2020-08-18 MED ORDER — BUPIVACAINE-EPINEPHRINE (PF) 0.25% -1:200000 IJ SOLN
INTRAMUSCULAR | Status: AC
  Filled 2020-08-18: qty 30

## 2020-08-18 MED ORDER — AMLODIPINE BESY-BENAZEPRIL HCL 5-20 MG PO CAPS: 1.0000 | ORAL_CAPSULE | Freq: Every day | ORAL | Status: DC

## 2020-08-18 MED ORDER — POLYVINYL ALCOHOL 1.4 % OP SOLN
1.0000 [drp] | OPHTHALMIC | Status: DC | PRN
  Filled 2020-08-18: qty 15

## 2020-08-18 MED ORDER — ORAL CARE MOUTH RINSE: 15.0000 mL | Freq: Once | OROMUCOSAL | Status: AC

## 2020-08-18 MED ORDER — ROCURONIUM BROMIDE 100 MG/10ML IV SOLN
INTRAVENOUS | Status: DC | PRN
  Administered 2020-08-18: 50 mg via INTRAVENOUS
  Administered 2020-08-18 (×2): 20 mg via INTRAVENOUS

## 2020-08-18 MED ORDER — HYDROMORPHONE HCL 1 MG/ML IJ SOLN
INTRAMUSCULAR | Status: AC
  Administered 2020-08-18: 0.5 mg via INTRAVENOUS
  Filled 2020-08-18: qty 1

## 2020-08-18 MED ORDER — ACETAMINOPHEN 325 MG PO TABS: 650.0000 mg | ORAL_TABLET | ORAL | Status: DC | PRN

## 2020-08-18 MED ORDER — LACTATED RINGERS IV SOLN: INTRAVENOUS | Status: DC

## 2020-08-18 MED ORDER — ROCURONIUM BROMIDE 10 MG/ML (PF) SYRINGE
PREFILLED_SYRINGE | INTRAVENOUS | Status: AC
  Filled 2020-08-18: qty 10

## 2020-08-18 MED ORDER — ONDANSETRON HCL 4 MG/2ML IJ SOLN
INTRAMUSCULAR | Status: AC
  Filled 2020-08-18: qty 2

## 2020-08-18 MED ORDER — KETOROLAC TROMETHAMINE 15 MG/ML IJ SOLN
INTRAMUSCULAR | Status: AC
  Filled 2020-08-18: qty 1

## 2020-08-18 MED ORDER — LACTATED RINGERS IV SOLN
INTRAVENOUS | Status: DC | PRN
  Administered 2020-08-18: 1000 mL

## 2020-08-18 MED ORDER — DIPHENHYDRAMINE HCL 50 MG/ML IJ SOLN: 12.5000 mg | Freq: Four times a day (QID) | INTRAMUSCULAR | Status: DC | PRN

## 2020-08-18 MED ORDER — PROPOFOL 10 MG/ML IV BOLUS
INTRAVENOUS | Status: AC
  Filled 2020-08-18: qty 20

## 2020-08-18 MED ORDER — PHENYLEPHRINE 40 MCG/ML (10ML) SYRINGE FOR IV PUSH (FOR BLOOD PRESSURE SUPPORT)
PREFILLED_SYRINGE | INTRAVENOUS | Status: AC
  Filled 2020-08-18: qty 10

## 2020-08-18 MED ORDER — DEXAMETHASONE SODIUM PHOSPHATE 10 MG/ML IJ SOLN
INTRAMUSCULAR | Status: AC
  Filled 2020-08-18: qty 1

## 2020-08-18 MED ORDER — DOCUSATE SODIUM 100 MG PO CAPS
100.0000 mg | ORAL_CAPSULE | Freq: Two times a day (BID) | ORAL | Status: DC
  Administered 2020-08-18 – 2020-08-20 (×4): 100 mg via ORAL
  Filled 2020-08-18 (×5): qty 1

## 2020-08-18 MED ORDER — BACITRACIN-NEOMYCIN-POLYMYXIN 400-5-5000 EX OINT: 1.0000 "application " | TOPICAL_OINTMENT | Freq: Three times a day (TID) | CUTANEOUS | Status: DC | PRN

## 2020-08-18 MED ORDER — SODIUM CHLORIDE 0.9 % IR SOLN
Status: DC | PRN
  Administered 2020-08-18: 1000 mL via INTRAVESICAL

## 2020-08-18 MED ORDER — SUCCINYLCHOLINE CHLORIDE 200 MG/10ML IV SOSY
PREFILLED_SYRINGE | INTRAVENOUS | Status: AC
  Filled 2020-08-18: qty 10

## 2020-08-18 MED ORDER — FENTANYL CITRATE (PF) 100 MCG/2ML IJ SOLN
INTRAMUSCULAR | Status: DC | PRN
  Administered 2020-08-18 (×4): 50 ug via INTRAVENOUS

## 2020-08-18 MED ORDER — SODIUM CHLORIDE (PF) 0.9 % IJ SOLN
INTRAMUSCULAR | Status: AC
  Filled 2020-08-18: qty 10

## 2020-08-18 MED ORDER — DIPHENHYDRAMINE HCL 12.5 MG/5ML PO ELIX
12.5000 mg | ORAL_SOLUTION | Freq: Four times a day (QID) | ORAL | Status: DC | PRN
  Filled 2020-08-18: qty 10

## 2020-08-18 MED ORDER — TRAMADOL HCL 50 MG PO TABS
50.0000 mg | ORAL_TABLET | Freq: Four times a day (QID) | ORAL | 0 refills | Status: AC | PRN
Start: 2020-08-18 — End: ?

## 2020-08-18 MED ORDER — HYDROMORPHONE HCL 1 MG/ML IJ SOLN: 0.5000 mg | INTRAMUSCULAR | Status: DC | PRN

## 2020-08-18 MED ORDER — SULFAMETHOXAZOLE-TRIMETHOPRIM 800-160 MG PO TABS: 1.0000 | ORAL_TABLET | Freq: Two times a day (BID) | ORAL | 0 refills | Status: AC

## 2020-08-18 MED ORDER — BUPIVACAINE-EPINEPHRINE 0.25% -1:200000 IJ SOLN
INTRAMUSCULAR | Status: DC | PRN
  Administered 2020-08-18: 30 mL

## 2020-08-18 MED ORDER — DOCUSATE SODIUM 100 MG PO CAPS: 100.0000 mg | ORAL_CAPSULE | Freq: Two times a day (BID) | ORAL | Status: DC

## 2020-08-18 MED ORDER — HEPARIN SODIUM (PORCINE) 1000 UNIT/ML IJ SOLN
INTRAMUSCULAR | Status: AC
  Filled 2020-08-18: qty 1

## 2020-08-18 MED ORDER — PROPOFOL 10 MG/ML IV BOLUS
INTRAVENOUS | Status: DC | PRN
  Administered 2020-08-18: 160 mg via INTRAVENOUS

## 2020-08-18 MED ORDER — ESMOLOL HCL 100 MG/10ML IV SOLN
INTRAVENOUS | Status: AC
  Filled 2020-08-18: qty 10

## 2020-08-18 MED ORDER — SENNOSIDES-DOCUSATE SODIUM 8.6-50 MG PO TABS
2.0000 | ORAL_TABLET | Freq: Every day | ORAL | Status: DC
  Administered 2020-08-18 – 2020-08-19 (×2): 2 via ORAL
  Filled 2020-08-18 (×2): qty 2

## 2020-08-18 MED ORDER — ZOLPIDEM TARTRATE 5 MG PO TABS
5.0000 mg | ORAL_TABLET | Freq: Every evening | ORAL | Status: DC | PRN
  Administered 2020-08-19: 5 mg via ORAL
  Filled 2020-08-18: qty 1

## 2020-08-18 MED ORDER — FLEET ENEMA 7-19 GM/118ML RE ENEM: 1.0000 | ENEMA | Freq: Once | RECTAL | Status: DC

## 2020-08-18 MED ORDER — MIDAZOLAM HCL 2 MG/2ML IJ SOLN
INTRAMUSCULAR | Status: AC
  Filled 2020-08-18: qty 2

## 2020-08-18 MED ORDER — ONDANSETRON HCL 4 MG/2ML IJ SOLN
INTRAMUSCULAR | Status: DC | PRN
  Administered 2020-08-18: 4 mg via INTRAVENOUS

## 2020-08-18 MED ORDER — SODIUM CHLORIDE 0.9 % IV BOLUS
1000.0000 mL | Freq: Once | INTRAVENOUS | Status: AC
  Administered 2020-08-18: 1000 mL via INTRAVENOUS

## 2020-08-18 MED ORDER — CHLORHEXIDINE GLUCONATE 0.12 % MT SOLN
15.0000 mL | Freq: Once | OROMUCOSAL | Status: AC
  Administered 2020-08-18: 15 mL via OROMUCOSAL

## 2020-08-18 MED ORDER — BENAZEPRIL HCL 10 MG PO TABS
20.0000 mg | ORAL_TABLET | Freq: Every day | ORAL | Status: DC
  Administered 2020-08-18 – 2020-08-20 (×3): 20 mg via ORAL
  Filled 2020-08-18 (×2): qty 2
  Filled 2020-08-18: qty 1

## 2020-08-18 MED ORDER — AMLODIPINE BESYLATE 5 MG PO TABS
5.0000 mg | ORAL_TABLET | Freq: Every day | ORAL | Status: DC
  Administered 2020-08-18 – 2020-08-20 (×3): 5 mg via ORAL
  Filled 2020-08-18 (×3): qty 1

## 2020-08-18 MED ORDER — LIDOCAINE HCL (CARDIAC) PF 100 MG/5ML IV SOSY
PREFILLED_SYRINGE | INTRAVENOUS | Status: DC | PRN
  Administered 2020-08-18: 80 mg via INTRAVENOUS

## 2020-08-18 MED ORDER — MAGNESIUM CITRATE PO SOLN: 1.0000 | Freq: Once | ORAL | Status: DC

## 2020-08-18 MED ORDER — FENTANYL CITRATE (PF) 100 MCG/2ML IJ SOLN
INTRAMUSCULAR | Status: AC
  Filled 2020-08-18: qty 2

## 2020-08-18 MED ORDER — CEFAZOLIN SODIUM-DEXTROSE 2-4 GM/100ML-% IV SOLN
2.0000 g | Freq: Once | INTRAVENOUS | Status: AC
  Administered 2020-08-18: 2 g via INTRAVENOUS
  Filled 2020-08-18: qty 100

## 2020-08-18 MED ORDER — CHLORHEXIDINE GLUCONATE CLOTH 2 % EX PADS
6.0000 | MEDICATED_PAD | Freq: Every day | CUTANEOUS | Status: DC
  Administered 2020-08-18 – 2020-08-20 (×3): 6 via TOPICAL

## 2020-08-18 MED ORDER — LIDOCAINE 2% (20 MG/ML) 5 ML SYRINGE
INTRAMUSCULAR | Status: AC
  Filled 2020-08-18: qty 5

## 2020-08-18 MED ORDER — BELLADONNA ALKALOIDS-OPIUM 16.2-60 MG RE SUPP: 1.0000 | Freq: Four times a day (QID) | RECTAL | Status: DC | PRN

## 2020-08-18 MED ORDER — KCL IN DEXTROSE-NACL 20-5-0.45 MEQ/L-%-% IV SOLN: INTRAVENOUS | Status: DC

## 2020-08-18 MED ORDER — ONDANSETRON HCL 4 MG/2ML IJ SOLN: 4.0000 mg | INTRAMUSCULAR | Status: DC | PRN

## 2020-08-18 MED ORDER — CEFAZOLIN SODIUM-DEXTROSE 1-4 GM/50ML-% IV SOLN
1.0000 g | Freq: Three times a day (TID) | INTRAVENOUS | Status: AC
  Administered 2020-08-18 – 2020-08-19 (×2): 1 g via INTRAVENOUS
  Filled 2020-08-18: qty 50

## 2020-08-18 MED ORDER — KETOROLAC TROMETHAMINE 15 MG/ML IJ SOLN
15.0000 mg | Freq: Four times a day (QID) | INTRAMUSCULAR | Status: AC
  Administered 2020-08-18 – 2020-08-19 (×5): 15 mg via INTRAVENOUS
  Filled 2020-08-18 (×4): qty 1

## 2020-08-18 MED ORDER — STERILE WATER FOR IRRIGATION IR SOLN
Status: DC | PRN
  Administered 2020-08-18: 1000 mL

## 2020-08-18 MED ORDER — DEXAMETHASONE SODIUM PHOSPHATE 4 MG/ML IJ SOLN
INTRAMUSCULAR | Status: DC | PRN
  Administered 2020-08-18: 10 mg via INTRAVENOUS

## 2020-08-18 MED ORDER — MORPHINE SULFATE (PF) 4 MG/ML IV SOLN: 2.0000 mg | INTRAVENOUS | Status: DC | PRN

## 2020-08-18 MED ORDER — OXYCODONE HCL 5 MG PO TABS
ORAL_TABLET | ORAL | Status: AC
  Filled 2020-08-18: qty 1

## 2020-08-18 MED ORDER — PHENYLEPHRINE HCL (PRESSORS) 10 MG/ML IV SOLN
INTRAVENOUS | Status: DC | PRN
  Administered 2020-08-18: 120 ug via INTRAVENOUS
  Administered 2020-08-18 (×2): 80 ug via INTRAVENOUS
  Administered 2020-08-18: 40 ug via INTRAVENOUS

## 2020-08-18 MED ORDER — SUGAMMADEX SODIUM 200 MG/2ML IV SOLN
INTRAVENOUS | Status: DC | PRN
  Administered 2020-08-18: 150 mg via INTRAVENOUS

## 2020-08-18 MED ORDER — MIDAZOLAM HCL 2 MG/2ML IJ SOLN
INTRAMUSCULAR | Status: DC | PRN
  Administered 2020-08-18: 2 mg via INTRAVENOUS

## 2020-08-18 MED ORDER — OXYCODONE HCL 5 MG PO TABS
5.0000 mg | ORAL_TABLET | ORAL | Status: DC | PRN
  Administered 2020-08-18 (×2): 5 mg via ORAL
  Filled 2020-08-18: qty 1

## 2020-08-18 SURGICAL SUPPLY — 60 items
APPLICATOR COTTON TIP 6 STRL (MISCELLANEOUS) ×2 IMPLANT
APPLICATOR COTTON TIP 6IN STRL (MISCELLANEOUS) ×4
CATH FOLEY 2WAY SLVR 18FR 30CC (CATHETERS) ×4 IMPLANT
CATH ROBINSON RED A/P 16FR (CATHETERS) ×4 IMPLANT
CATH ROBINSON RED A/P 8FR (CATHETERS) ×4 IMPLANT
CATH TIEMANN FOLEY 18FR 5CC (CATHETERS) ×4 IMPLANT
CHLORAPREP W/TINT 26 (MISCELLANEOUS) ×4 IMPLANT
CLIP VESOLOCK LG 6/CT PURPLE (CLIP) ×8 IMPLANT
COVER SURGICAL LIGHT HANDLE (MISCELLANEOUS) ×4 IMPLANT
COVER TIP SHEARS 8 DVNC (MISCELLANEOUS) ×2 IMPLANT
COVER TIP SHEARS 8MM DA VINCI (MISCELLANEOUS) ×2
COVER WAND RF STERILE (DRAPES) IMPLANT
CUTTER ECHEON FLEX ENDO 45 340 (ENDOMECHANICALS) ×4 IMPLANT
DECANTER SPIKE VIAL GLASS SM (MISCELLANEOUS) ×4 IMPLANT
DERMABOND ADVANCED (GAUZE/BANDAGES/DRESSINGS) ×2
DERMABOND ADVANCED .7 DNX12 (GAUZE/BANDAGES/DRESSINGS) ×2 IMPLANT
DRAIN CHANNEL RND F F (WOUND CARE) IMPLANT
DRAPE ARM DVNC X/XI (DISPOSABLE) ×8 IMPLANT
DRAPE COLUMN DVNC XI (DISPOSABLE) ×2 IMPLANT
DRAPE DA VINCI XI ARM (DISPOSABLE) ×8
DRAPE DA VINCI XI COLUMN (DISPOSABLE) ×2
DRAPE SURG IRRIG POUCH 19X23 (DRAPES) ×4 IMPLANT
DRSG TEGADERM 4X4.75 (GAUZE/BANDAGES/DRESSINGS) ×4 IMPLANT
ELECT PENCIL ROCKER SW 15FT (MISCELLANEOUS) ×4 IMPLANT
ELECT REM PT RETURN 15FT ADLT (MISCELLANEOUS) ×4 IMPLANT
GLOVE BIO SURGEON STRL SZ 6.5 (GLOVE) ×3 IMPLANT
GLOVE BIO SURGEONS STRL SZ 6.5 (GLOVE) ×1
GLOVE BIOGEL M STRL SZ7.5 (GLOVE) ×8 IMPLANT
GOWN STRL REUS W/TWL LRG LVL3 (GOWN DISPOSABLE) ×12 IMPLANT
HOLDER FOLEY CATH W/STRAP (MISCELLANEOUS) ×4 IMPLANT
IRRIG SUCT STRYKERFLOW 2 WTIP (MISCELLANEOUS) ×4
IRRIGATION SUCT STRKRFLW 2 WTP (MISCELLANEOUS) ×2 IMPLANT
IV LACTATED RINGERS 1000ML (IV SOLUTION) ×4 IMPLANT
KIT TURNOVER KIT A (KITS) IMPLANT
NDL SAFETY ECLIPSE 18X1.5 (NEEDLE) ×2 IMPLANT
NEEDLE HYPO 18GX1.5 SHARP (NEEDLE) ×2
PACK ROBOT UROLOGY CUSTOM (CUSTOM PROCEDURE TRAY) ×4 IMPLANT
PENCIL SMOKE EVACUATOR (MISCELLANEOUS) IMPLANT
SEAL CANN UNIV 5-8 DVNC XI (MISCELLANEOUS) ×8 IMPLANT
SEAL XI 5MM-8MM UNIVERSAL (MISCELLANEOUS) ×8
SET TUBE SMOKE EVAC HIGH FLOW (TUBING) ×4 IMPLANT
SOLUTION ELECTROLUBE (MISCELLANEOUS) ×4 IMPLANT
STAPLE RELOAD 45 GRN (STAPLE) ×2 IMPLANT
STAPLE RELOAD 45MM GREEN (STAPLE) ×2
SUT ETHILON 3 0 PS 1 (SUTURE) ×4 IMPLANT
SUT MNCRL 3 0 RB1 (SUTURE) ×2 IMPLANT
SUT MNCRL 3 0 VIOLET RB1 (SUTURE) ×2 IMPLANT
SUT MNCRL AB 4-0 PS2 18 (SUTURE) ×8 IMPLANT
SUT MONOCRYL 3 0 RB1 (SUTURE) ×4
SUT NOVA NAB GS-21 0 18 T12 DT (SUTURE) ×4 IMPLANT
SUT VIC AB 0 CT1 27 (SUTURE) ×2
SUT VIC AB 0 CT1 27XBRD ANTBC (SUTURE) ×2 IMPLANT
SUT VIC AB 0 UR5 27 (SUTURE) ×4 IMPLANT
SUT VIC AB 2-0 SH 27 (SUTURE) ×2
SUT VIC AB 2-0 SH 27X BRD (SUTURE) ×2 IMPLANT
SUT VICRYL 0 UR6 27IN ABS (SUTURE) ×8 IMPLANT
SYR 27GX1/2 1ML LL SAFETY (SYRINGE) ×4 IMPLANT
TOWEL OR NON WOVEN STRL DISP B (DISPOSABLE) ×4 IMPLANT
TROCAR XCEL NON-BLD 5MMX100MML (ENDOMECHANICALS) IMPLANT
WATER STERILE IRR 1000ML POUR (IV SOLUTION) ×4 IMPLANT

## 2020-08-18 NOTE — Op Note (Addendum)
Preoperative diagnosis: Clinically localized adenocarcinoma of the prostate (clinical stage P1G N0 M0), umbilical hernia  Postoperative diagnosis: Clinically localized adenocarcinoma of the prostate (clinical stage F8M N0 M0), umbilical hernia  Procedure:  1. Robotic assisted laparoscopic radical prostatectomy (bilateral nerve sparing) 2. Bilateral robotic assisted laparoscopic pelvic lymphadenectomy 3. Umbilical hernia repair  Surgeon: Pryor Curia. M.D.  Assistant: Debbrah Alar, PA-C  An assistant was required for this surgical procedure.  The duties of the assistant included but were not limited to suctioning, passing suture, camera manipulation, retraction. This procedure would not be able to be performed without an Environmental consultant.  Resident: Dr. Ermelinda Das  Anesthesia: General  Complications: None  EBL: 100 mL  IVF:  1500 mL crystalloid  Specimens: 1. Prostate and seminal vesicles 2. Right pelvic lymph nodes 3. Left pelvic lymph nodes  Disposition of specimens: Pathology  Drains: 1. 20 Fr coude catheter 2. # 19 Blake pelvic drain  Indication: Hayden Cooper is a 54 y.o. year old patient with clinically localized prostate cancer.  After a thorough review of the management options for treatment of prostate cancer, he elected to proceed with surgical therapy and the above procedure(s).  We have discussed the potential benefits and risks of the procedure, side effects of the proposed treatment, the likelihood of the patient achieving the goals of the procedure, and any potential problems that might occur during the procedure or recuperation. Informed consent has been obtained.  Description of procedure:  The patient was taken to the operating room and a general anesthetic was administered. He was given preoperative antibiotics, placed in the dorsal lithotomy position, and prepped and draped in the usual sterile fashion. Next a preoperative timeout was performed. A  urethral catheter was placed into the bladder and a site was selected near the umbilicus for placement of the camera port. This was placed using a standard open Hassan technique which allowed entry into the peritoneal cavity under direct vision and without difficulty. An 8 mm robotic port was placed and a pneumoperitoneum established. The camera was then used to inspect the abdomen and there was no evidence of any intra-abdominal injuries or other abnormalities except for some omental adhesions attached to the umbilical hernia defect. The remaining abdominal ports were then placed. 8 mm robotic ports were placed in the right lower quadrant, left lower quadrant, and far left lateral abdominal wall. A 5 mm port was placed in the right upper quadrant and a 12 mm port was placed in the right lateral abdominal wall for laparoscopic assistance. All ports were placed under direct vision without difficulty. The omental adhesions were taken down with laparoscopic scissors. The surgical cart was then docked.   Utilizing the cautery scissors, the bladder was reflected posteriorly allowing entry into the space of Retzius and identification of the endopelvic fascia and prostate. The periprostatic fat was then removed from the prostate allowing full exposure of the endopelvic fascia. The endopelvic fascia was then incised from the apex back to the base of the prostate bilaterally and the underlying levator muscle fibers were swept laterally off the prostate thereby isolating the dorsal venous complex. The dorsal vein was then stapled and divided with a 45 mm Flex Echelon stapler. Attention then turned to the bladder neck which was divided anteriorly thereby allowing entry into the bladder and exposure of the urethral catheter. The catheter balloon was deflated and the catheter was brought into the operative field and used to retract the prostate anteriorly. The posterior bladder neck  was then examined and was divided allowing  further dissection between the bladder and prostate posteriorly until the vasa deferentia and seminal vessels were identified. The vasa deferentia were isolated, divided, and lifted anteriorly. The seminal vesicles were dissected down to their tips with care to control the seminal vascular arterial blood supply. These structures were then lifted anteriorly and the space between Denonvillier's fascia and the anterior rectum was developed with a combination of sharp and blunt dissection. This isolated the vascular pedicles of the prostate.  The lateral prostatic fascia was then sharply incised allowing release of the neurovascular bundles bilaterally. The vascular pedicles of the prostate were then ligated with Weck clips between the prostate and neurovascular bundles and divided with sharp cold scissor dissection resulting in neurovascular bundle preservation. The neurovascular bundles were then separated off the apex of the prostate and urethra bilaterally.  The urethra was then sharply transected allowing the prostate specimen to be disarticulated. The pelvis was copiously irrigated and hemostasis was ensured. There was no evidence for rectal injury.  Attention then turned to the right pelvic sidewall. The fibrofatty tissue between the external iliac vein, confluence of the iliac vessels, hypogastric artery, and Cooper's ligament was dissected free from the pelvic sidewall with care to preserve the obturator nerve. Weck clips were used for lymphostasis and hemostasis. An identical procedure was performed on the contralateral side and the lymphatic packets were removed for permanent pathologic analysis.  Attention then turned to the urethral anastomosis. A 2-0 Vicryl slip knot was placed between Denonvillier's fascia, the posterior bladder neck, and the posterior urethra to reapproximate these structures. A double-armed 3-0 Monocryl suture was then used to perform a 360 running tension-free anastomosis  between the bladder neck and urethra. A new urethral catheter was then placed into the bladder and irrigated. There were no blood clots within the bladder and the anastomosis appeared to be watertight. A #19 Blake drain was then brought through the left lateral 8 mm port site and positioned appropriately within the pelvis. It was secured to the skin with a nylon suture. The surgical cart was then undocked. The right lateral 12 mm port site was closed at the fascial level with a 0 Vicryl suture placed laparoscopically. All remaining ports were then removed under direct vision. The prostate specimen was removed intact within the Endopouch retrieval bag via the periumbilical camera port site. This fascial opening was then extended inferiorly into the umbilical hernia defect.  There was some fat in the hernia defect that was excised. The fascia including the hernia defect (which measured about 1.5 cm in diameter) was closed with interrupted figure of eight 0-Novofil sutures. 0.25% Marcaine was then injected into all port sites and all incisions were reapproximated at the skin level with 4-0 Monocryl subcuticular sutures and Dermabond. The patient appeared to tolerate the procedure well and without complications. The patient was able to be extubated and transferred to the recovery unit in satisfactory condition.   Pryor Curia MD

## 2020-08-18 NOTE — Discharge Instructions (Signed)

## 2020-08-18 NOTE — Interval H&P Note (Signed)
History and Physical Interval Note:  08/18/2020 6:52 AM  Hayden Cooper  has presented today for surgery, with the diagnosis of PROSTATE CANCER, UMBILICAL HERNIA.  The various methods of treatment have been discussed with the patient and family. After consideration of risks, benefits and other options for treatment, the patient has consented to  Procedure(s) with comments: XI ROBOTIC ASSISTED LAPAROSCOPIC RADICAL PROSTATECTOMY LEVEL 2 (N/A) - ONLY NEEDS 210 MIN FOR ALL PROCEDURES LYMPHADENECTOMY, PELVIC (Bilateral) HERNIA REPAIR UMBILICAL ADULT (N/A) as a surgical intervention.  The patient's history has been reviewed, patient examined, no change in status, stable for surgery.  I have reviewed the patient's chart and labs.  Questions were answered to the patient's satisfaction.     [employer]

## 2020-08-18 NOTE — Progress Notes (Signed)
Patient ID: Hayden Cooper, male   DOB: 1966/07/30, 54 y.o.   MRN: [id number]  Post-op note  Subjective: The patient is doing well.  No complaints.  Objective: Vital signs in last 24 hours: Temp:  [98.1 F (36.7 C)-98.8 F (37.1 C)] 98.8 F (37.1 C) (11/18 1500) Pulse Rate:  [69-103] 94 (11/18 1500) Resp:  [12-20] 16 (11/18 1500) BP: (122-145)/(62-98) 136/89 (11/18 1500) SpO2:  [96 %-100 %] 98 % (11/18 1500) Weight:  [77.7 kg] 77.7 kg (11/18 0619)  Intake/Output from previous day: No intake/output data recorded. Intake/Output this shift: Total I/O In: 2550 [I.V.:1400; IV Piggyback:1150] Out: 925 [Urine:825; Blood:100]  Physical Exam:  General: Alert and oriented. Abdomen: Soft, Nondistended. Incisions: Clean and dry. GU: Urine draining well.  Lab Results: Recent Labs    08/18/20 1113  HGB 14.7  HCT 45.0    Assessment/Plan: POD#0   1) Continue to monitor, ambulate, IS   Pryor Curia. MD   LOS: 0 days   Dutch Gray 08/18/2020, 3:24 PM

## 2020-08-18 NOTE — Anesthesia Postprocedure Evaluation (Signed)
Anesthesia Post Note  Patient: Hayden Cooper  Procedure(s) Performed: XI ROBOTIC ASSISTED LAPAROSCOPIC RADICAL PROSTATECTOMY LEVEL 2 (N/A ) LYMPHADENECTOMY, PELVIC (Bilateral ) HERNIA REPAIR UMBILICAL ADULT (N/A )     Patient location during evaluation: PACU Anesthesia Type: General Level of consciousness: awake Pain management: pain level controlled Vital Signs Assessment: post-procedure vital signs reviewed and stable Respiratory status: spontaneous breathing Cardiovascular status: stable Postop Assessment: no apparent nausea or vomiting Anesthetic complications: no   No complications documented.  Last Vitals:  Vitals:   08/18/20 1130 08/18/20 1145  BP: (!) 134/91 (!) 141/92  Pulse: 91 88  Resp: 20 18  Temp:  36.9 C  SpO2: 99% 99%    Last Pain:  Vitals:   08/18/20 1145  TempSrc:   PainSc: 5                  Khrista Braun

## 2020-08-18 NOTE — Anesthesia Procedure Notes (Signed)
Procedure Name: Intubation Date/Time: 08/18/2020 7:27 AM Performed by: Niel Hummer, CRNA Pre-anesthesia Checklist: Patient identified, Emergency Drugs available, Suction available and Patient being monitored Patient Re-evaluated:Patient Re-evaluated prior to induction Oxygen Delivery Method: Circle System Utilized Preoxygenation: Pre-oxygenation with 100% oxygen Induction Type: IV induction Ventilation: Mask ventilation without difficulty Laryngoscope Size: 2 Grade View: Grade I Tube type: Oral Tube size: 7.5 mm Number of attempts: 1 Airway Equipment and Method: Stylet and Oral airway Placement Confirmation: ETT inserted through vocal cords under direct vision,  positive ETCO2 and breath sounds checked- equal and bilateral Secured at: 22 cm Tube secured with: Tape Dental Injury: Teeth and Oropharynx as per pre-operative assessment  Comments: Placed by Hilda Blades

## 2020-08-18 NOTE — Transfer of Care (Signed)
Immediate Anesthesia Transfer of Care Note  Patient: Hayden Cooper  Procedure(s) Performed: XI ROBOTIC ASSISTED LAPAROSCOPIC RADICAL PROSTATECTOMY LEVEL 2 (N/A ) LYMPHADENECTOMY, PELVIC (Bilateral ) HERNIA REPAIR UMBILICAL ADULT (N/A )  Patient Location: PACU  Anesthesia Type:General  Level of Consciousness: awake, alert  and oriented  Airway & Oxygen Therapy: Patient Spontanous Breathing and Patient connected to face mask oxygen  Post-op Assessment: Report given to RN, Post -op Vital signs reviewed and stable and Patient moving all extremities X 4  Post vital signs: Reviewed and stable  Last Vitals:  Vitals Value Taken Time  BP    Temp    Pulse    Resp    SpO2      Last Pain:  Vitals:   08/18/20 0619  TempSrc:   PainSc: 0-No pain         Complications: No complications documented.

## 2020-08-18 NOTE — Progress Notes (Signed)
Brother at bedside 

## 2020-08-19 ENCOUNTER — Encounter (HOSPITAL_COMMUNITY): Payer: Self-pay | Admitting: Urology

## 2020-08-19 ENCOUNTER — Other Ambulatory Visit: Payer: Self-pay

## 2020-08-19 DIAGNOSIS — C61 Malignant neoplasm of prostate: Secondary | ICD-10-CM | POA: Diagnosis not present

## 2020-08-19 LAB — HEMOGLOBIN AND HEMATOCRIT, BLOOD
HCT: 42.2 % (ref 39.0–52.0)
Hemoglobin: 14 g/dL (ref 13.0–17.0)

## 2020-08-19 MED ORDER — TRAMADOL HCL 50 MG PO TABS
50.0000 mg | ORAL_TABLET | Freq: Four times a day (QID) | ORAL | Status: DC | PRN
  Administered 2020-08-19 – 2020-08-20 (×4): 100 mg via ORAL
  Filled 2020-08-19 (×4): qty 2

## 2020-08-19 MED ORDER — BISACODYL 10 MG RE SUPP
10.0000 mg | Freq: Once | RECTAL | Status: AC
  Administered 2020-08-19: 10 mg via RECTAL
  Filled 2020-08-19: qty 1

## 2020-08-19 NOTE — Treatment Plan (Addendum)
Ambulated with pt this am in hallway. JP drain dressing saturated-dressing changed.

## 2020-08-19 NOTE — Discharge Summary (Signed)
Date of admission: 08/18/2020  Date of discharge: 08/20/2020  Admission diagnosis: Prostate Cancer  Discharge diagnosis: Prostate Cancer  History and Physical: For full details, please see admission history and physical. Briefly, ISHAM SMITHERMAN is a 54 y.o. gentleman with localized prostate cancer.  After discussing management/treatment options, he elected to proceed with surgical treatment.  Hospital Course: THADEUS GANDOLFI was taken to the operating room on 08/18/2020 and underwent a robotic assisted laparoscopic radical prostatectomy. He tolerated this procedure well and without complications. Postoperatively, he was able to be transferred to a regular hospital room following recovery from anesthesia.  He was able to begin ambulating the day after surgery. He remained hemodynamically stable overnight.  He had excellent urine output with appropriately minimal output from his pelvic drain and his pelvic drain was removed on POD #1.  He was transitioned to oral pain medication, tolerated a clear liquid diet, but felt somewhat dizzy the afternoon of POD #1.  Some mild dizzinies POD 2 that was transient. By the AM of POD 3, the day of discharge, he is ambulatory, pain controlled with PO meds, Hgb acceptable at 13.6, path pending, maintining PO nutrition, and felt to be adequate for discharge.   Laboratory values: Recent Labs    08/18/20 1113  HGB 14.7  HCT 45.0    EXAM: AOx3, pleasant Non-labored breathign on RA Reg heart rate SNTND, recent port/extraction sites c/d/i. Dry dressing over recent JP site that is non-saturated. Foley in placed with medium yellow urine that is non foul No c/c/e   Disposition: Home  Discharge instruction: He was instructed to be ambulatory but to refrain from heavy lifting, strenuous activity, or driving. He was instructed on urethral catheter care.  Discharge medications:     Followup: He will followup in 1 week for catheter removal and to discuss his  surgical pathology results.

## 2020-08-19 NOTE — Progress Notes (Signed)
Patient ID: Hayden Cooper, male   DOB: November 26, 1965, 54 y.o.   MRN: [id number]  Pt doing better.  Ambulating and tolerating liquids. C/O of some dizziness and anxious to go home. Has passed flatus.  Will monitor overnight and recheck Hgb in AM with plans to d/c home in the morning if labs are ok.

## 2020-08-19 NOTE — Plan of Care (Signed)

## 2020-08-19 NOTE — Progress Notes (Signed)
Patient ID: Hayden Cooper, male   DOB: 1966/02/08, 54 y.o.   MRN: [id number]  1 Day Post-Op Subjective: The patient is doing well.  No nausea or vomiting. Blood on sheets and around drain site.  5 AM labs were not yet drawn.  Pt has not been ambulated since arrival to the floor.  Objective: Vital signs in last 24 hours: Temp:  [97.8 F (36.6 C)-98.8 F (37.1 C)] 98.5 F (36.9 C) (11/19 0010) Pulse Rate:  [80-103] 83 (11/19 0420) Resp:  [12-20] 18 (11/19 0420) BP: (122-145)/(62-98) 126/89 (11/19 0420) SpO2:  [96 %-100 %] 98 % (11/19 0420)  Intake/Output from previous day: 11/18 0701 - 11/19 0700 In: 4060 [P.O.:360; I.V.:2500; IV Piggyback:1200] Out: 3390 [Urine:3200; Drains:90; Blood:100] Intake/Output this shift: Total I/O In: 1150 [I.V.:1100; IV Piggyback:50] Out: 2542 [HCWCB:7628; Drains:40]  Physical Exam:  General: Alert and oriented. GI: Soft, Nondistended. Incisions: Clean, dry, and intact, drain site with bloody drainage around site and on sheet, minimal drainage within drain Urine: Clear Extremities: Nontender, no erythema, no edema.  Lab Results: Recent Labs    08/18/20 1113  HGB 14.7  HCT 45.0      Assessment/Plan: POD# 1 s/p robotic prostatectomy.  1) SL IVF 2) Need to ambulate, Incentive spirometry 3) Transition to oral pain medication 4) Dulcolax suppository 5) D/C pelvic drain if Hgb stable this morning 6) Plan for likely discharge later although may be delayed since patient has yet to ambulate and depending on lab results   Pryor Curia. MD   LOS: 0 days   Dutch Gray 08/19/2020, 6:53 AM

## 2020-08-20 DIAGNOSIS — C61 Malignant neoplasm of prostate: Secondary | ICD-10-CM | POA: Diagnosis not present

## 2020-08-20 LAB — HEMOGLOBIN AND HEMATOCRIT, BLOOD
HCT: 41.2 % (ref 39.0–52.0)
Hemoglobin: 13.6 g/dL (ref 13.0–17.0)

## 2020-08-20 NOTE — Progress Notes (Signed)
Pt discharged home with foley catheter intact, patent and draining. Pt a&o times 3 with no c/o pain or discomfort.

## 2020-08-20 NOTE — Plan of Care (Signed)
Pt is alert and oriented times 3 and stable. Pt took prn medication for abdominal pain. Medication was effective. No s/s of sepsis on this shift. Problem: Education: Goal: Knowledge of General Education information will improve Description: Including pain rating scale, medication(s)/side effects and non-pharmacologic comfort measures Outcome: Progressing   Problem: Health Behavior/Discharge Planning: Goal: Ability to manage health-related needs will improve Outcome: Progressing   Problem: Clinical Measurements: Goal: Ability to maintain clinical measurements within normal limits will improve Outcome: Progressing Goal: Will remain free from infection Outcome: Progressing Goal: Diagnostic test results will improve Outcome: Progressing Goal: Respiratory complications will improve Outcome: Progressing Goal: Cardiovascular complication will be avoided Outcome: Progressing   Problem: Activity: Goal: Risk for activity intolerance will decrease Outcome: Progressing   Problem: Nutrition: Goal: Adequate nutrition will be maintained Outcome: Progressing Problem: Coping: Goal: Level of anxiety will decrease Outcome: Progressing   Problem: Elimination: Goal: Will not experience complications related to bowel motility Outcome: Progressing Goal: Will not experience complications related to urinary retention Outcome: Progressing   Problem: Pain Managment: Goal: General experience of comfort will improve Outcome: Progressing   Problem: Safety: Goal: Ability to remain free from injury will improve Outcome: Progressing   Problem: Skin Integrity: Goal: Risk for impaired skin integrity will decrease Outcome: Progressing

## 2020-08-24 LAB — SURGICAL PATHOLOGY
